# Patient Record
Sex: Male | Born: 1975 | Race: White | Hispanic: No | Marital: Single | Smoking: Never smoker
Health system: Southern US, Community
[De-identification: ages and names within clinical notes are randomized; demographics above are authoritative.]

---

## 2009-05-06 ENCOUNTER — Ambulatory Visit: Payer: Self-pay | Admitting: Sports Medicine

## 2009-05-06 ENCOUNTER — Encounter: Admission: RE | Admit: 2009-05-06 | Discharge: 2009-05-06 | Payer: Self-pay | Admitting: Sports Medicine

## 2009-05-06 DIAGNOSIS — T148XXA Other injury of unspecified body region, initial encounter: Secondary | ICD-10-CM

## 2009-05-06 DIAGNOSIS — R109 Unspecified abdominal pain: Secondary | ICD-10-CM | POA: Insufficient documentation

## 2009-06-17 ENCOUNTER — Ambulatory Visit: Payer: Self-pay | Admitting: Sports Medicine

## 2010-08-03 IMAGING — CR DG PELVIS 1-2V
1 series · 1 of 1 positions shown · non-contrast
Comparison: None

CLINICAL DATA: Pelvic pain, no trauma, the patient is a runner

PELVIS - 1-2 VIEW

[view not recorded]
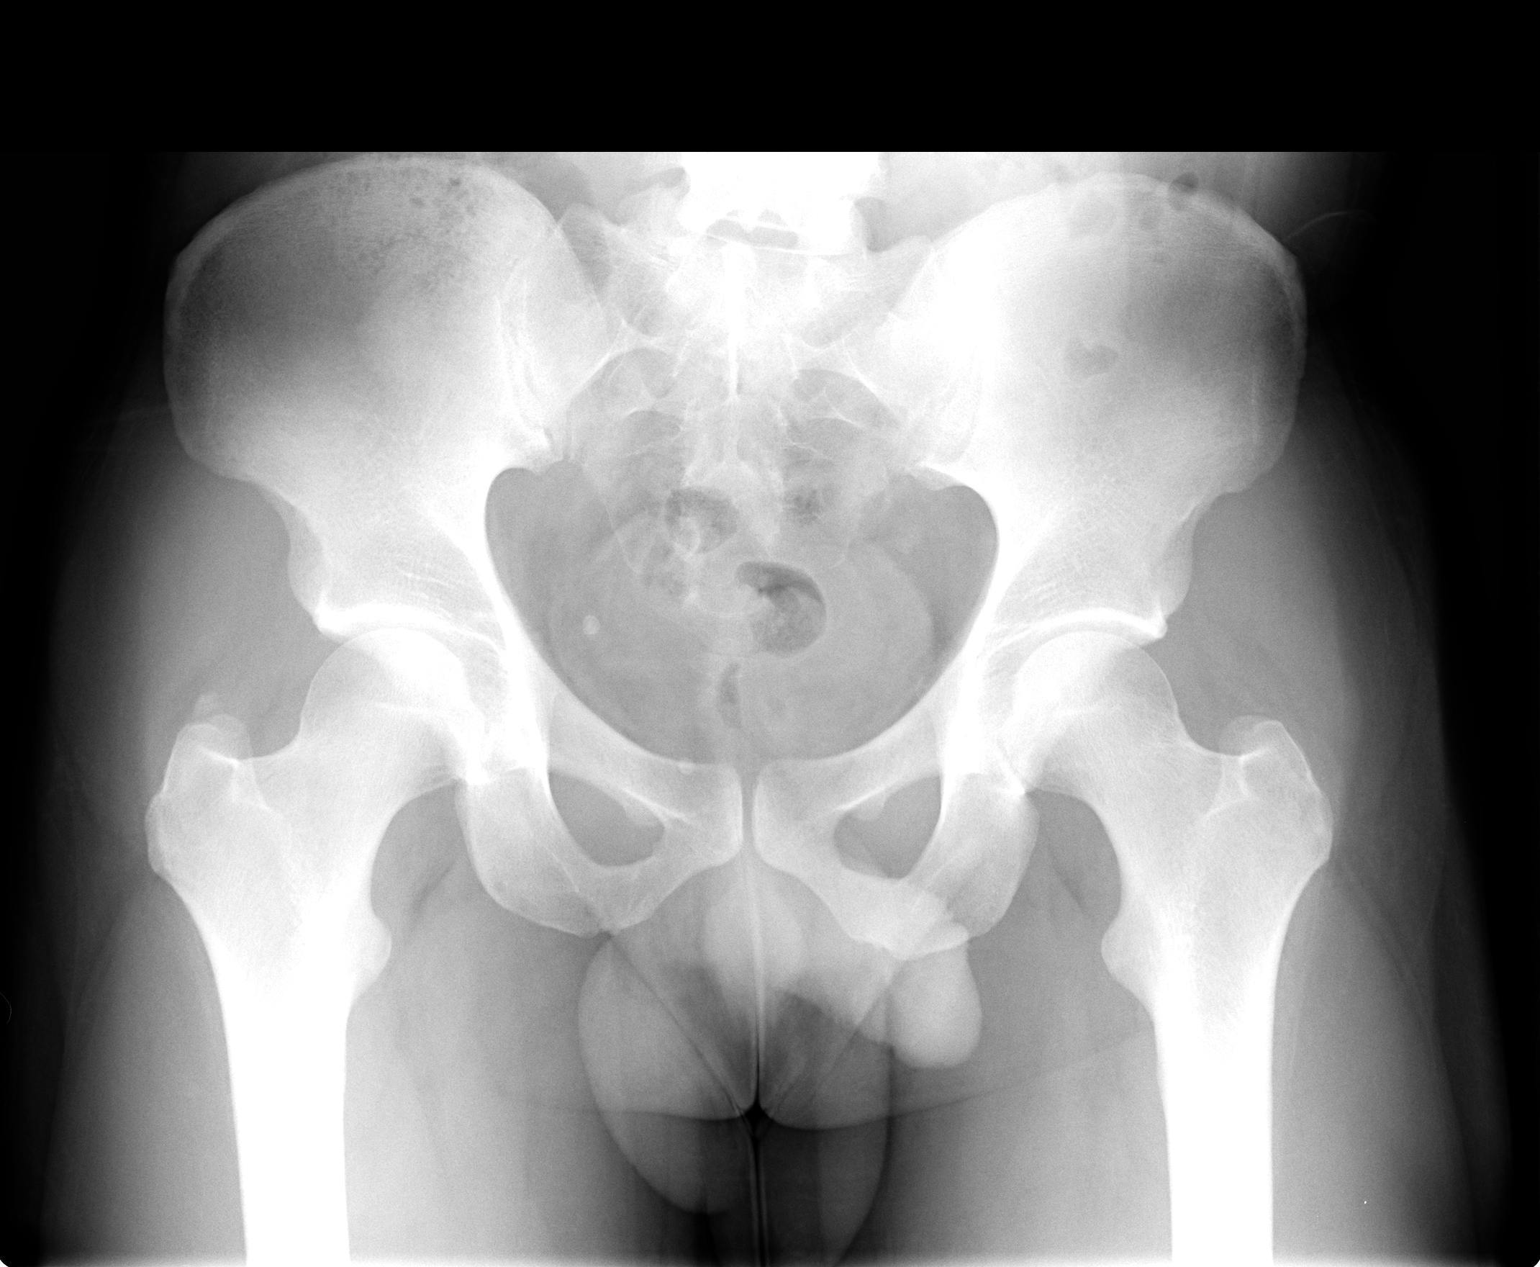

[1 of 1 positions shown; findings below may reference images not displayed]

FINDINGS: No acute bony abnormality is seen.  Both hip joint spaces
appear normal and the pelvic rami are intact.  There is a faint
calcific density just superior to the right femoral greater
trochanter.  This does not appear to be well corticated to indicate
a small avulsion fragment and may be more typical of calcific
tendonitis.  The SI joints appear normal.
IMPRESSION: No acute abnormality.  Faint calcification just above the right
femoral greater trochanter may represent calcific tendonitis.

## 2011-08-01 ENCOUNTER — Ambulatory Visit (INDEPENDENT_AMBULATORY_CARE_PROVIDER_SITE_OTHER): Payer: BC Managed Care – PPO | Admitting: Sports Medicine

## 2011-08-01 VITALS — BP 120/80 | Ht 65.0 in | Wt 135.0 lb

## 2011-08-01 DIAGNOSIS — M25539 Pain in unspecified wrist: Secondary | ICD-10-CM | POA: Insufficient documentation

## 2011-08-01 MED ORDER — KETOPROFEN POWD: Status: DC

## 2011-08-01 NOTE — Progress Notes (Signed)
Bilateral wrist pain for 4 weeks. Robert Lozano was mowing his parents lawn with a unusual lawn mower for one hour 4 weeks ago. Following the lawnmower he noted some wrist pain. 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   Right wrist. Pain on the radial aspect especially with ulnar deviation in wrist extension.  No numbness tingling or weakness.  His right wrist hurts more than his left wrist. He is right-hand dominant  Left wrist pain on the ulnar aspect especially with ulnar deviation and flexion. Also no tingling numbness or weakness.  PMH reviewed.  ROS as above otherwise neg Medications reviewed. Current Outpatient Prescriptions  Medication Sig Dispense Refill  . clonazePAM (KLONOPIN) 0.5 MG tablet       . diclofenac (VOLTAREN) 75 MG EC tablet       . Ketoprofen POWD Ketoprofen Jell 20% compounded. Apply to both wrists q6 hours as needed for pain.  60 g  99  . PROTOPIC 0.1 % ointment         Exam:  BP 120/80  Ht 5\' 5"  (1.651 m)  Wt 135 lb (61.236 kg)  BMI 22.47 kg/m2 Gen: Well NAD MSK: Hands and wrists are normal appearing bilaterally with normal range of motion and strength and sensation bilaterally.  Nontender over the anatomical snuff box on the right. Some pain with Lourena Simmonds test on right.  On left nontender also over the ulnar stylette. Some pain with ulnar deviation.  MSK [place]: No significant swelling in the first compartment on the right wrist. Mild to moderate swelling over second third fourth and fifth compartments bilaterally second compartment worse bilaterally

## 2011-08-01 NOTE — Patient Instructions (Signed)
Thank you for coming in today. Apply the little braces with writing. Make sure to use good position.  Apply the jell as needed.  Come back in 1 month if not better.  Will need to take the Rx to a compounding pharmacy -- [facility] [place] [facility]

## 2011-08-01 NOTE — Assessment & Plan Note (Signed)
Wrist pain bilaterally. Ultrasound is nondiagnostic of de Quervain's tenosynovitis. Ultrasound is diagnostic for overuse versus strain. Plan to use to soft wrist strap bilaterally and topical ketoprofen gel. We'll followup in one month if not improved. Also advised working on posture when writing

## 2011-09-27 ENCOUNTER — Ambulatory Visit (INDEPENDENT_AMBULATORY_CARE_PROVIDER_SITE_OTHER): Payer: BC Managed Care – PPO | Admitting: Sports Medicine

## 2011-09-27 DIAGNOSIS — M2170 Unequal limb length (acquired), unspecified site: Secondary | ICD-10-CM

## 2011-09-27 DIAGNOSIS — M79673 Pain in unspecified foot: Secondary | ICD-10-CM | POA: Insufficient documentation

## 2011-09-27 DIAGNOSIS — M79609 Pain in unspecified limb: Secondary | ICD-10-CM

## 2011-09-27 DIAGNOSIS — M25539 Pain in unspecified wrist: Secondary | ICD-10-CM

## 2011-09-27 NOTE — Assessment & Plan Note (Signed)
Sports insole with a felt lift on the left densely improved his gait and was comfortable

## 2011-09-27 NOTE — Assessment & Plan Note (Signed)
40% improved, cont ketoprofen. RTC prn.

## 2011-09-27 NOTE — Progress Notes (Signed)
  Subjective:    Patient ID: Robert Lozano, male    DOB: 1976/04/29, 36 y.o.   MRN: [id number]  HPI Bilateral wrist pain: Improved approximately 40%, using ketoprofen. Clinically it was more of a extensor cmpt 2, 3 and 4 tendinitis on the right side, and extensor carpi ulnaris tendinitis on the left side.  Bilateral foot pain: Described left worse than right, as a tingling sensation on the outer aspect of his forefoot. He has been using minimalist shoes but has really been out of running for about 6 weeks because of his symptoms. No pain coming from his back. No numbness or tingling up the lower leg.  Social history: Nonsmoker.  Review of Systems    No fevers, chills, night sweats, weight loss, chest pain, or shortness of breath.  Objective:   Physical Exam General:  Well developed, well nourished, and in no acute distress. Neuro:  Alert and oriented x3, extra-ocular muscles intact. Skin: Warm and dry, no rashes noted. Respiratory:  Not using accessory muscles, speaking in full sentences.  Bilateral Wrist: Inspection normal with no visible erythema or swelling. ROM smooth and normal with good flexion and extension and ulnar/radial deviation that is symmetrical with opposite wrist. Palpation is normal over metacarpals, navicular, lunate, and TFCC; tendons without tenderness/ swelling No snuffbox tenderness. No tenderness over Canal of Guyon. Strength 5/5 in all directions without pain. Negative Finkelstein, tinel's and phalens. Negative Watson's test.  Foot exam: Mildly cavus foot bilaterally. No tenderness to palpation. Full strength to inversion, eversion, dorsiflexion, plantar flexion. Negative Tinel's over the common peroneal nerve. Inventory with a nonantalgic gait. Left leg 85 centimeters, right leg 87 cm. Runs with left foot turned insignificantly.  Sports insoles placed with a 5/16 inch wedge in the left shoe. Patient runs very comfortably in these. Notably decreased  in toeing of the left foot.     Assessment & Plan:

## 2011-09-27 NOTE — Patient Instructions (Signed)
Corrected your leg length discrepancy. Come back to see [place] in 4-6 weeks. Move the insoles into each of your shoes through the day.

## 2011-09-27 NOTE — Assessment & Plan Note (Signed)
Likely soft tissue contusion related to abnormal gait and leg length discrepancy. SI with lift on left. May get back into running. RTC 4-6 weeks.

## 2011-12-26 ENCOUNTER — Ambulatory Visit (INDEPENDENT_AMBULATORY_CARE_PROVIDER_SITE_OTHER): Payer: BC Managed Care – PPO | Admitting: Sports Medicine

## 2011-12-26 VITALS — BP 138/90

## 2011-12-26 DIAGNOSIS — M79673 Pain in unspecified foot: Secondary | ICD-10-CM

## 2011-12-26 DIAGNOSIS — M2170 Unequal limb length (acquired), unspecified site: Secondary | ICD-10-CM

## 2011-12-26 DIAGNOSIS — M79609 Pain in unspecified limb: Secondary | ICD-10-CM

## 2011-12-26 NOTE — Assessment & Plan Note (Signed)
I think we will need to add arch straps to RT foot to stabilize cuboid  Given some HEP for manipulation of cuboid when painful  Reck 2 to 3 mos

## 2011-12-26 NOTE — Progress Notes (Signed)
  Subjective:    Patient ID: Robert Lozano, male    DOB: 11/16/1975, 36 y.o.   MRN: [id number]  HPI  Pt presents to clinic for f/u of rt lateral foot pain, lt foot numbness which are still bothering him. Lt foot is only numb laterally in the mornings with his first few steps- then improves.  This is over MT area Rt lateral foot pain is worse after running or after standing up from seated position. Has been using green insoles with 2 cm lift on lt side. No real change with these corrections  Also having elbow pain x1 week.  Pain starts below elbow and extends to tip of olecranon.  Hurts worse when he has been typing a lot.     Review of Systems     Objective:   Physical Exam NAD  Left leg shorter than rt - 1.5 cms ~ Manipulation of rt midfoot - has some pain at cuboid/MT junction No swelling Foot structure good with normal arch No tenderness in rest of foot Normal callus pattern  Still has preservation of transverse arch on RT and LT  Slight puffiness over rt olecranon region, not on lt Full ROM and normal exam of RT and LT elbow        Assessment & Plan:  Elbow pain- suspect this is pressure on olecranon fossa No bursal swelling noted  Advised to cushion arm rest for typing or writing Use some ice massage

## 2011-12-26 NOTE — Patient Instructions (Addendum)
Place soft pad under elbow when you are going a lot of typing to help with elbow pain.   Do Ice massages with styrofoam cup with frozen water at the end of the day  Use arch strap for increased activity  Please follow up in 1 month.  Thank you for seeing [place] today!

## 2011-12-26 NOTE — Assessment & Plan Note (Signed)
I reduced height of lift to ~ 1cm on left  I think this may be more comfortable Hopefully this will be enough not to overload RT foot  IF MT sxs persist we will add a MT pad on left/  Not today

## 2012-09-25 ENCOUNTER — Ambulatory Visit (INDEPENDENT_AMBULATORY_CARE_PROVIDER_SITE_OTHER): Payer: BC Managed Care – PPO | Admitting: Sports Medicine

## 2012-09-25 VITALS — BP 108/70 | Ht 65.0 in | Wt 135.0 lb

## 2012-09-25 DIAGNOSIS — M25579 Pain in unspecified ankle and joints of unspecified foot: Secondary | ICD-10-CM

## 2012-09-25 NOTE — Assessment & Plan Note (Signed)
Balance series of exercises to use of the right ankle and left ankle Use an Xcompression brace for the right ankle for all sports  Okay to resume running and activities but buildup the exercises before going to trails  Recheck if this is not doing well

## 2012-09-25 NOTE — Progress Notes (Signed)
Patient ID: Robert Lozano, male   DOB: June 24, 1976, 37 y.o.   MRN: [id number]  Problem with rolling RT ankle Has done this several times on trail runs Also slipped on floor and inverted ankles No real swelling noted mild but much less pain on the left ankle  Back problem Mid back Felt in summer after moving furniture Also writing with bad posture He has seen Dr. Vear Clock and does get some temporary relief with therapy No radicular symptoms  Examination Left ankle Ankle: No visible erythema or swelling. Range of motion is full in all directions. Mild increase in inversion and plantar flexion  Strength is 5/5 in all directions. Stable lateral and medial ligaments; squeeze test and kleiger test unremarkable; Talar dome nontender; No pain at base of 5th MT; No tenderness over cuboid; No tenderness over N spot or navicular prominence No tenderness on posterior aspects of lateral and medial malleolus No sign of peroneal tendon subluxations; Negative tarsal tunnel tinel's Able to walk 4 steps.  1 foot balance on left ankle is quite good even with repeated cone touch   Right ankle one plus positive anterior drawer Increased laxity on inversion and plantar flexion Laxity and motion of the fibula on Kleiger testing Subtalar testing with increased motion  Back No real tenderness to palpation but he does point to an area at the intersection of the trapezius and latissimus dorsi in the mid thoracic spine degrees had some of the tenderness   Impression  Back issues seems to be a minor strain  Plan Postural exercises Support for the back when weight lifting Pushups for rehabilitation  He had full strength and full motion in testing this area

## 2013-01-01 ENCOUNTER — Ambulatory Visit (INDEPENDENT_AMBULATORY_CARE_PROVIDER_SITE_OTHER): Payer: BC Managed Care – PPO | Admitting: Sports Medicine

## 2013-01-01 VITALS — BP 108/60 | Ht 65.0 in | Wt 135.0 lb

## 2013-01-01 DIAGNOSIS — M25512 Pain in left shoulder: Secondary | ICD-10-CM

## 2013-01-01 DIAGNOSIS — M25519 Pain in unspecified shoulder: Secondary | ICD-10-CM

## 2013-01-01 NOTE — Progress Notes (Signed)
  Subjective:    Patient ID: Robert Lozano, male    DOB: 1976/05/07, 37 y.o.   MRN: [id number]  HPI  Pt presents to clinic for evaluation of posterior lt shoulder pain x 1 month. Thinks this may be related to working on computer. 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 He does not note any particular injury. He does feel that sometimes the elevates his left arm quickly in basketball to block a shot.   F/u of rt ankle- doing well.   Using cross ankle brace for running. Continues to do home exercises. Able to run without problems now.   Known anxiety disorder for which he takes Celexa and Klonopin    Review of Systems     Objective:   Physical Exam NAD Full neck ROM  Rt trapezius spasms  Lt shoulder exam: Scapular protraction on Lt Strong IR and ER at 90 degree strong Abduction and elevation strong in all planes Empty can neg  Hawkins neg OBriens neg Mild scapular winging after 10 wall push ups       Assessment & Plan:

## 2013-01-01 NOTE — Assessment & Plan Note (Signed)
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

## 2013-01-01 NOTE — Patient Instructions (Addendum)
Do suggested exercises daily  1.Lawnmower  2. Robbery   3. Up right rows   4. up right chest fly  5. Shake outs for trapezium spasms   Please follow up as needed   Thank you for seeing [place] today!

## 2013-07-08 ENCOUNTER — Ambulatory Visit: Payer: BC Managed Care – PPO | Admitting: Sports Medicine

## 2013-07-09 ENCOUNTER — Ambulatory Visit: Payer: BC Managed Care – PPO | Admitting: Sports Medicine

## 2013-07-16 ENCOUNTER — Ambulatory Visit: Payer: BC Managed Care – PPO | Admitting: Sports Medicine

## 2013-07-23 ENCOUNTER — Encounter: Payer: Self-pay | Admitting: Sports Medicine

## 2013-07-23 ENCOUNTER — Ambulatory Visit (INDEPENDENT_AMBULATORY_CARE_PROVIDER_SITE_OTHER): Payer: BC Managed Care – PPO | Admitting: Sports Medicine

## 2013-07-23 VITALS — BP 130/86 | Ht 65.0 in | Wt 138.0 lb

## 2013-07-23 DIAGNOSIS — M778 Other enthesopathies, not elsewhere classified: Secondary | ICD-10-CM

## 2013-07-23 DIAGNOSIS — M7701 Medial epicondylitis, right elbow: Secondary | ICD-10-CM

## 2013-07-23 DIAGNOSIS — M7700 Medial epicondylitis, unspecified elbow: Secondary | ICD-10-CM

## 2013-07-23 NOTE — Patient Instructions (Signed)
Thank you for coming in today  For your medial elbow - decrease pull ups in half - do wrist flexion and grip exercises  For your triceps - decrease push ups in half - try elbow compression sleeve

## 2013-07-23 NOTE — Progress Notes (Signed)
CC: Right elbow pain HPI: Patient returns today for evaluation of right elbow pain that has been intermittently bothersome to him for the last several years. We previously treated him for his shoulder, ankle, and mid back which have all improved. He has been working hard on improving his posture while spending long hours at the computer writing. He has pain of his right elbow in the area of the medial epicondyle as well as the triceps insertion at the elbow. He notes that this seems to be worse when he is in a position of elbow flexion for a long period of time such as prolonged typing or driving. It is also worsened by doing things like basketball, throwing a football, pushups and pull-ups. He has not really tried anything for this so far. He also states that when he tries to lift his bag he does have some pain in the medial elbow as well.  ROS: As above in the HPI. All other systems are stable or negative.  OBJECTIVE: APPEARANCE:  Patient in no acute distress.The patient appeared well nourished and normally developed. HEENT: No scleral icterus. Conjunctiva non-injected Resp: Non labored Skin: No rash MSK:  Right elbow: - Mild tenderness to palpation at medial epicondyle and flexor tendon insertion - No laxity on UCL testing and no pain - Able to fully flex and extend as well as pronation and supination without pain - No swelling - Non-tender at the triceps insertion. - Strength is 5 out of 5 in wrist flexion and extension as well as pronation and supination without pain  MSK [place]: Limited ultrasound of the right elbow was performed in transverse and longitudinal views. At the medial epicondyle there was noted to be a small hyperechoic spur with surrounding hypoechoic change of the tendon. No increased Doppler flow. No evidence of tear. At the triceps tendon there was increased hypoechoic signal surrounding the tendons on transverse view with separation of the 3 bundles   ASSESSMENT: #1.  Medial epicondylitis, right #2. Triceps tendinopathy, right  PLAN: Patient will decrease his pushups and pull-ups by half. He was fitted with an elbow compression sleeve from our sample stock. We did discuss with him that he may need to have a sleeve that provides more compression and he may need to order a titer sleeve in the future. He was given some wrist flexion and grip exercises to do for medial epicondylitis. We will see him back as needed.

## 2014-06-18 ENCOUNTER — Ambulatory Visit: Payer: Self-pay | Admitting: Sports Medicine

## 2017-05-15 ENCOUNTER — Ambulatory Visit (INDEPENDENT_AMBULATORY_CARE_PROVIDER_SITE_OTHER): Payer: Managed Care, Other (non HMO) | Admitting: Sports Medicine

## 2017-05-15 ENCOUNTER — Encounter (INDEPENDENT_AMBULATORY_CARE_PROVIDER_SITE_OTHER): Payer: Self-pay

## 2017-05-15 DIAGNOSIS — M2170 Unequal limb length (acquired), unspecified site: Secondary | ICD-10-CM

## 2017-05-15 DIAGNOSIS — M79671 Pain in right foot: Secondary | ICD-10-CM

## 2017-05-15 DIAGNOSIS — M79672 Pain in left foot: Secondary | ICD-10-CM

## 2017-05-15 NOTE — Progress Notes (Signed)
Chief complaint: Bilateral plantar foot pain, left worse than right x 2 years, left hamstring pain 6 months, left knee pain 2 months  History of present illness: Tinnie GensJeffrey is a 41 year old male who presents to the sports medicine office today with chief complaint of bilateral plantar foot pain. He reports that symptoms have been ongoing for approximately 2 years. He reports that the left side is worse than right side. Subsequently, he reports that he is been having some left hamstring pain for the last 6 months as well as left knee pain for the last 2 months. He reports pain on the plantar aspect of both of his feet, started about 2 years ago. Does not report of any inciting factor, trauma, or incident to explain the pain. He is an avid runner as well as cyclist. He reports not being able to run over the last 2 months secondary to pain. He reports pain along the plantar insertion on the calcaneus, running up all way up to the great toe bilaterally. He reports pain first thing in the morning and taking a few steps. He reports that he did get orthotics from a local running store a few months ago, reports that actually made his symptoms worse. He reports pain in the arch of the foot. He reports that he has been doing physical therapy up in [place] where he lives, reports tightening and pulling pain in his posterior leg along the hamstring muscles. He reports that he does do a lot of standing as part of his job, reports bending forward and noticing some low back pain over the last few months. He has tried occasional ibuprofen, with no relief of symptoms. He describes the pain overall is an aching pain, currently rates it as a 4/10.  Past medical history, past surgical history, family history, social history reviewed. No pertinent orthopedic past medical history, no surgeries involving his feet, ankles, or knees, does not report of any current tobacco use  Allergies: No known drug allergies  Review of  systems: As stated above  Physical exam: Vital signs reviewed and are documented in chart General: Alert, oriented, appears stated age, in no apparent distress HEENT: Moist oral mucosa Respiratory: Normal respirations, able to speak in full sentences Cardiac: Normal peripheral pulses, regular rate Integumentary: No rashes on visible skin Neurologic: 5/5 in bilateral lower extremities, sensation 2+ in bilateral lower extremities Psychiatric: Appropriate affect and mood Musculoskeletal: Inspection of bilateral feet reveal that he does have bilateral calcaneal valgus, with collapsed longitudinal arch bilaterally, left leg is approximately 1 cm shorter than the right, pronation noted with gait evaluation, no warmth, erythema, ecchymosis, or effusion noted, he is tender to palpation on the plantar insertion on the calcaneus bilaterally, no pain with dorsiflexion, plantar flexion, inversion, eversion, inspection of left knee reveals no obvious deformity or muscle atrophy, no warmth, erythema, ecchymosis, or effusion, no tenderness to palpation along quadriceps tendon, patellar tendon, medial joint line, lateral joint line, Lachman, anterior drawer, valgus, varus stress testing negative, McMurray negative, great range of motion and full range of motion with hip flexion, hip extension, hip external, hip internal range of motion, hip abduction strength intact  Musculoskeletal ultrasound: Limited ultrasound was performed in the office today of bilateral plantar fascia. He does have normal plantar fascia length bilaterally, no evidence of plantar fasciitis based on size of plantar fascia. No hypoechogenicity seen bilaterally.  Impression: No evidence of plantar fasciitis in bilateral feet  Assessment and plan: 1. 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  Bilateral foot pain -Discussed with Trey Paula based on ultrasound findings he does not have any evidence  of plantar fasciitis, discussed this is secondary to structural arch collapse of longitudinal arch bilaterally, discussed fixing calcaneal valgus and pronation, this will help with his knee pain as well as hamstring pain that he is having -Gave patient agreed insoles with bilateral medial heel wedge, he did do trial walk and run in office, reported no pain and no issues -Will provide hamstring extensor exercises  Discussed with Trey Paula to communicate with [place] in 1 month regarding any interval changes. Discussed option discussed orthotics if he wishes to pursue this. Otherwise, will have him follow-up on as-needed basis.   Haynes Kerns, MD Primary Care Sports Medicine Fellow [facility]

## 2018-11-12 ENCOUNTER — Ambulatory Visit (INDEPENDENT_AMBULATORY_CARE_PROVIDER_SITE_OTHER): Payer: Self-pay | Admitting: Sports Medicine

## 2018-11-12 ENCOUNTER — Ambulatory Visit: Payer: Self-pay

## 2018-11-12 ENCOUNTER — Encounter: Payer: Self-pay | Admitting: Sports Medicine

## 2018-11-12 VITALS — BP 123/87 | Ht 65.0 in | Wt 135.0 lb

## 2018-11-12 DIAGNOSIS — M25511 Pain in right shoulder: Secondary | ICD-10-CM

## 2018-11-12 DIAGNOSIS — M778 Other enthesopathies, not elsewhere classified: Secondary | ICD-10-CM

## 2018-11-12 DIAGNOSIS — M7521 Bicipital tendinitis, right shoulder: Secondary | ICD-10-CM | POA: Insufficient documentation

## 2018-11-12 DIAGNOSIS — M779 Enthesopathy, unspecified: Secondary | ICD-10-CM

## 2018-11-12 DIAGNOSIS — G5620 Lesion of ulnar nerve, unspecified upper limb: Secondary | ICD-10-CM | POA: Insufficient documentation

## 2018-11-12 DIAGNOSIS — M25521 Pain in right elbow: Secondary | ICD-10-CM

## 2018-11-12 NOTE — Assessment & Plan Note (Signed)
-  Complete diagnostic ultrasound of the right shoulder was performed today.  It demonstrated biceps tendinitis with hypoechoic swelling of the biceps tendon sheath in the bicipital groove.  He also had mild infraspinatus hypoechoic fluid. -Recommend biceps eccentric exercises for strengthening daily for the next 4 to 6 weeks -If not improving afterwards, would add a nitroglycerin patch with Tylenol -May also consider an ultrasound-guided corticosteroid injection if not improving -Activity modification at the gym to avoid excessive supination

## 2018-11-12 NOTE — Progress Notes (Signed)
Robert Lozano - 43 y.o. 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  He states his wrist pain is a recurrent issue for him.  He types on the computer upwards of 8 to 10 hours/day as he currently is writing a nonfiction novel about his father and his work in the [employer].  With prolonged typing, he is getting dorsal wrist and forearm pain.  He states is described as stiffness and now progressed to pain rated 5 out of 10 in nature.  He has tried modifying some of his typing positions without any significant relief.  The past week however, his symptoms have slightly improved.  He denies any numbness or tingling of his hands.  No elbow pain associated with this problem.  Denies any trauma or injury to the area.  No weakness or loss of grip strength from this.  He is also here today with anterior right shoulder pain.  No injury or trauma to the area.  He is an avid Licensed conveyancer.  He states that supination pronation exercises exacerbate his symptoms.  Even lifting a milk jug does seem to bother him.  He has tried relative rest from heavy weightlifting without any significant relief.  He is also tried over-the-counter anti-inflammatories.  Denies any numbness of the shoulder.  No neck pain.   Review of systems:  A complete review of systems was performed and pertinent positives and negatives discussed above in the HPI.   Interval past medical history, surgical history, family history, and social history obtained and are unchanged.   Of note he has a history of extensor tendinitis.  He is a Clinical research associate.  He is a non-smoker.  Medications reviewed and unchanged. Allergies: No known drug allergies.  OBJECTIVE:  Physical exam: Vital signs are reviewed. BP 123/87   Ht 5\' 5"  (1.651 m)   Wt 135 lb  (61.2 kg)   BMI 22.47 kg/m   Gen.: Alert, oriented, appears stated age, in no apparent distress HEENT: Moist oral mucosa Respiratory: Normal respirations, able to speak in full sentences Cardiac: Regular rate, distal pulses 2+ Integumentary: No rashes, ecchymoses or skin lesions Neurologic:  Sensation is intact to light touch C5-T1 bilaterally.  Nonfocal neurologic examination.  Reflexes are grossly intact. Psych: Normal affect, mood is described as good.  Very pleasant Musculoskeletal: Inspection of his right upper extremity demonstrates no obvious abnormalities.  Tenderness to palpation over his bicipital groove anteriorly on the right.  Tenderness palpation over his extensor digitorum muscle belly on the right forearm and left forearm.  Tenderness to palpation over his extensor carpi ulnaris tendon on the left side.  Full range of motion however pain is elicited with extension of the wrist bilaterally.  Mild discomfort with full flexion of the shoulder.  Full active range of motion of the shoulder.  Strength testing is 5 out of 5 in all ranges of motion of the shoulder and wrist.  Negative Hawkins test.  Negative Neer's test.  Positive speeds test.  Negative O'Brien's test.  Negative empty can test.  Negative Tinel's test of the wrist.  Negative Finkelstein's test bilaterally.  Neurovascularly intact.  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 Fields, MD     ASSESSMENT & PLAN: Extensor tendinitis of hand -Likely an occupational hazard given that he is typing for such a long duration on a daily basis due to work -Recommend an ergonomic  approach to treatment of his symptoms.  Placed a pad underneath his wrist to allow a neutral or slightly flexed position to take stress off of his extensor tendons. -Home exercise program was given today for extension and flexion exercises of the wrist with strengthening.  Complete these daily for the next 4 to 6 weeks.  Follow-up afterwards if still painful -Anti-inflammatories as needed for this problem -Tennis elbow strap given today to help relieve some of his extensor tendons.  To be worn distal more in the mid forearm region  Biceps tendinitis on right -Complete diagnostic ultrasound of the right shoulder was performed today.  It demonstrated biceps tendinitis with hypoechoic swelling of the biceps tendon sheath in the bicipital groove.  He also had mild infraspinatus hypoechoic fluid. -Recommend biceps eccentric exercises for strengthening daily for the next 4 to 6 weeks -If not improving afterwards, would add a nitroglycerin patch with Tylenol -May also consider an ultrasound-guided corticosteroid injection if not improving -Activity modification at the gym to avoid excessive supination   Orders Placed This Encounter  Procedures  . [place] COMPLETE JOINT SPACE STRUCTURES UP RIGHT    Standing Status:   Future    Number of Occurrences:   1    Standing Expiration Date:   01/12/2020    Order Specific Question:   Reason for Exam (SYMPTOM  OR DIAGNOSIS REQUIRED)    Answer:   right shoulder pain    Order Specific Question:   Preferred imaging location?    Answer:   Internal  . [place] LIMITED JOINT SPACE STRUCTURES UP RIGHT    Standing Status:   Future    Standing Expiration Date:   01/11/2020    Order Specific Question:   Reason for Exam (SYMPTOM  OR DIAGNOSIS REQUIRED)    Answer:   right elbow pain    Order Specific Question:   Preferred imaging location?    Answer:   Internal     Gustavus Messing, DO Sports Medicine Fellow [facility]  I observed and examined the patient with Dr. Laureen Ochs and  agree with assessment and plan.  Note reviewed and modified by me. Sterling Big, MD

## 2018-11-12 NOTE — Patient Instructions (Signed)
It is great to see you today for your office visit.  Today we diagnosed you with:  1.  Extensor tendinitis of both wrists, specifically extensor carpi ulnaris tendinitis on the left wrist 2.  Biceps tendinitis of the right shoulder 3.  Mild rotator cuff tendinopathy  We are recommending a series of exercises to help with the symptoms.  To start, we gave you wrist flexion and extension exercises that you can perform with light dumbbells no more than 5 to 10 pounds.  Next, you will perform biceps exercises sets of 10 on a daily basis.  Finally, we gave you a forearm strap to try and take stress off of your extensor muscle belly of your most affected arm.  If you like this, we may be able to get you a another one for the opposite arm.  Furthermore, I am recommending you look at the positioning of your keyboard and you try to get in a neutral or slightly flexed position with typing.  Take frequent breaks every 1-2 hours and perform your wrist exercises to avoid excessive firing of your extensor tendons.  If you continue to have significant problems, feel free to schedule a follow-up appointment and we can discuss further treatment options including topical therapy and/or nitroglycerin patches.

## 2018-11-12 NOTE — Assessment & Plan Note (Addendum)
-  Likely an occupational hazard given that he is typing for such a long duration on a daily basis due to work -Recommend an ergonomic approach to treatment of his symptoms.  Placed a pad underneath his wrist to allow a neutral or slightly flexed position to take stress off of his extensor tendons. -Home exercise program was given today for extension and flexion exercises of the wrist with strengthening.  Complete these daily for the next 4 to 6 weeks.  Follow-up afterwards if still painful -Anti-inflammatories as needed for this problem -Tennis elbow strap given today to help relieve some of his extensor tendons.  To be worn distal more in the mid forearm region

## 2019-02-27 ENCOUNTER — Ambulatory Visit (INDEPENDENT_AMBULATORY_CARE_PROVIDER_SITE_OTHER): Payer: 59 | Admitting: Sports Medicine

## 2019-02-27 ENCOUNTER — Other Ambulatory Visit: Payer: Self-pay

## 2019-02-27 DIAGNOSIS — G5620 Lesion of ulnar nerve, unspecified upper limb: Secondary | ICD-10-CM

## 2019-02-27 MED ORDER — PREDNISONE 20 MG PO TABS: 20.0000 mg | ORAL_TABLET | Freq: Two times a day (BID) | ORAL | 0 refills | Status: DC

## 2019-02-27 NOTE — Progress Notes (Signed)
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 With the COVID issues he has been teaching online as well as daily writing Seen 3/10 with bilateral wirist pain and RT shoulder pain Exercises took away shoulder pain Tennis elbow straps helped lessen wrist pain  However past few weeks more tingling and some pain in 4th and 5th fingers only but bilaterally  ROS No neck pain No radicular sxs No swelling of hand or wrist joints  PE Pleasant M in NAD BP 138/76   Ht 5\' 5"  (1.651 m)   Wt 135 lb (61.2 kg)   BMI 22.47 kg/m   Wrists: full extension and flexion Full ulnar and radial deviation  No weakness to resisted testing No weaness with resisted extension of flexion of 4th and 5th fingers Good intrinsic strength  Pressure over cubital tunnel - no tingling or pain Pressure over hook of hamate - can feel some tingling

## 2019-02-27 NOTE — Assessment & Plan Note (Addendum)
I believe he has been putting pressure on ulnar nerve with excessive keyboard time Has not made ergonomic changes Since this is bilateral computer seems likely trigger and also all the online teacing  Trial of prednisone 20 bid x 7 days Ergonomic padding for wrists during typind Cut back keyboard time Start Vit B6 100 mg daily  Let us know result in 4 to 6 weeks (now lives in [place])

## 2019-02-27 NOTE — Patient Instructions (Signed)
You have some pressure on the ulnar nerve that innervates the 4th and 5th fingers Very likely due to excess computer work  We want to settle down the nerve inflammation Use Prednisone for 1 week - 20 mg twice a day  However this can be slow so use Vitamin B 6 100 mgm per day for 3 months  Change ergonomic position for key board

## 2022-09-27 ENCOUNTER — Ambulatory Visit: Payer: 59 | Admitting: Family Medicine

## 2022-10-12 ENCOUNTER — Ambulatory Visit: Payer: 59 | Admitting: Sports Medicine

## 2022-10-12 ENCOUNTER — Ambulatory Visit: Payer: Self-pay

## 2022-10-12 VITALS — BP 128/92 | Ht 65.0 in | Wt 137.0 lb

## 2022-10-12 DIAGNOSIS — M25532 Pain in left wrist: Secondary | ICD-10-CM | POA: Diagnosis not present

## 2022-10-12 DIAGNOSIS — M25531 Pain in right wrist: Secondary | ICD-10-CM

## 2022-10-12 DIAGNOSIS — G5601 Carpal tunnel syndrome, right upper limb: Secondary | ICD-10-CM

## 2022-10-12 DIAGNOSIS — G5600 Carpal tunnel syndrome, unspecified upper limb: Secondary | ICD-10-CM | POA: Insufficient documentation

## 2022-10-12 NOTE — Patient Instructions (Signed)
It was great to see you today!  For your wrists: -Try the wrist braces we provided -Vitamin B6 100 mg daily -Soak hands/wrists in warm water daily at nighttime -Follow up in 3 months

## 2022-10-12 NOTE — Assessment & Plan Note (Signed)
Since his symptoms are not severe we will start with using cock-up splints for the next 3 months on both wrists Vitamin B6 Warm water baths to work hand motion Avoid anything that puts compression on the palmar side of the wrist Modify his computer use as needed Recheck pending his response in the next 3 months  He could be a candidate for hydrodissection of his median nerve if the symptoms persist

## 2022-10-12 NOTE — Progress Notes (Signed)
Complaint bilateral wrist pain right worse than left  Patient is a Probation officer.  He spends a lot of time on the computer.  He uses the computer for teaching. 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 With exercises and an ergonomic modification of his computer board this resolved.  He states that he has some periodically uncomfortable tingling into the right hand and is second third and fourth fingers.  It can be worse at night.  On the left side he has some occasional tingling and it also goes primarily to the second and third fingers.  He does not note any grip weakness He does not note any neck symptoms  He does have some chronic anxiety and notes that his blood pressure was up when he came for this visit.  He is some periodic Klonopin for anxiety  Physical exam Muscular white male in no acute distress BP (!) 128/92   Ht 5\' 5"  (1.651 m)   Wt 137 lb (62.1 kg)   BMI 22.80 kg/m   Wrist shows excellent range of motion in all directions Strength testing is good in all directions Tinel's and Phalen's tests do not recreate his symptoms I cannot detect any weakness on finger flexion or other finger motions  Grip strength with ergometer reveals the right hand to be 48 pounds versus 34 pounds on the left And testing individual fingers there are no large discrepancies  Ultrasound of median nerve bilaterally On the right wrist the ultrasound measures a circumferential area of 0.16 cm On the left wrist the ultrasound measures a circumferential area of 0.12 cm Following the ultrasound the median nerve throughout the forearm looks normal bilaterally There were no cysts or abnormal structures noted in the palmar wrist bilaterally  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 Oneida Alar, MD

## 2023-01-11 ENCOUNTER — Ambulatory Visit: Payer: 59 | Admitting: Sports Medicine
